# Patient Record
Sex: Female | Born: 1940
Health system: Southern US, Community
[De-identification: ages and names within clinical notes are randomized; demographics above are authoritative.]

## PROBLEM LIST (undated history)

## (undated) DIAGNOSIS — I341 Nonrheumatic mitral (valve) prolapse: Secondary | ICD-10-CM

---

## 1976-01-27 HISTORY — PX: BACK SURGERY: SHX140

## 1983-01-27 HISTORY — PX: ABDOMINAL HYSTERECTOMY: SHX81

## 1983-01-27 HISTORY — PX: APPENDECTOMY: SHX54

## 1990-01-26 HISTORY — PX: COLON SURGERY: SHX602

## 1997-11-28 ENCOUNTER — Other Ambulatory Visit: Admission: RE | Admit: 1997-11-28 | Discharge: 1997-11-28 | Payer: Self-pay | Admitting: Obstetrics and Gynecology

## 1999-09-12 ENCOUNTER — Other Ambulatory Visit: Admission: RE | Admit: 1999-09-12 | Discharge: 1999-09-12 | Payer: Self-pay | Admitting: Obstetrics and Gynecology

## 2000-09-15 ENCOUNTER — Other Ambulatory Visit: Admission: RE | Admit: 2000-09-15 | Discharge: 2000-09-15 | Payer: Self-pay | Admitting: Obstetrics and Gynecology

## 2002-07-07 ENCOUNTER — Encounter: Payer: Self-pay | Admitting: Allergy and Immunology

## 2002-07-07 ENCOUNTER — Encounter: Admission: RE | Admit: 2002-07-07 | Discharge: 2002-07-07 | Payer: Self-pay | Admitting: *Deleted

## 2016-02-06 ENCOUNTER — Emergency Department (HOSPITAL_BASED_OUTPATIENT_CLINIC_OR_DEPARTMENT_OTHER)
Admission: EM | Admit: 2016-02-06 | Discharge: 2016-02-06 | Disposition: A | Discharge status: Home / Self Care | Payer: Medicare Other | Attending: Emergency Medicine | Admitting: Emergency Medicine

## 2016-02-06 ENCOUNTER — Encounter (HOSPITAL_BASED_OUTPATIENT_CLINIC_OR_DEPARTMENT_OTHER): Payer: Self-pay | Admitting: *Deleted

## 2016-02-06 ENCOUNTER — Emergency Department (HOSPITAL_BASED_OUTPATIENT_CLINIC_OR_DEPARTMENT_OTHER): Payer: Medicare Other

## 2016-02-06 DIAGNOSIS — J181 Lobar pneumonia, unspecified organism: Secondary | ICD-10-CM | POA: Diagnosis not present

## 2016-02-06 DIAGNOSIS — R05 Cough: Secondary | ICD-10-CM | POA: Diagnosis present

## 2016-02-06 DIAGNOSIS — J189 Pneumonia, unspecified organism: Secondary | ICD-10-CM

## 2016-02-06 DIAGNOSIS — H66002 Acute suppurative otitis media without spontaneous rupture of ear drum, left ear: Secondary | ICD-10-CM | POA: Diagnosis not present

## 2016-02-06 HISTORY — DX: Nonrheumatic mitral (valve) prolapse: I34.1

## 2016-02-06 MED ORDER — AZITHROMYCIN 250 MG PO TABS: 250.0000 mg | ORAL_TABLET | Freq: Every day | ORAL | 0 refills | Status: AC

## 2016-02-06 MED FILL — AZITHROMYCIN 250 MG TABLET: 250 | 5 days supply | Qty: 6 | Fill #0

## 2016-02-06 NOTE — ED Provider Notes (Signed)
[facility]-EMERGENCY DEPT Provider Note   CSN: [id number] Arrival date & time: 02/06/16 [id number]     History    Chief Complaint  Patient presents with  . Cough     HPI Kim Howell is a 76 y.o. female.  76yo F who p/w cough and fever.5 days ago, the patient began feeling generally unwell and then developed a severe sore throat that lasted one day. She then developed a cough that is nonproductive and is associated with postnasal drip. She was having intermittent low-grade fevers until this morning when she had a fever of 101. She took Advil prior to arrival. She notes some associated left ear pain and "crackling" sounds in left ear that developed after sore throat. The vomiting, diarrhea, abdominal pain, chest pain, or shortness of breath.   Past Medical History:  Diagnosis Date  . MVP (mitral valve prolapse)      There are no active problems to display for this patient.   Past Surgical History:  Procedure Laterality Date  . ABDOMINAL HYSTERECTOMY  1985   total hysterectomy with appenditis removal  . APPENDECTOMY  1985   done with hysterectomy  . BACK SURGERY  1978   L4-5 fusion  . COLON SURGERY  1992    OB History    No data available        Home Medications    Prior to Admission medications   Medication Sig Start Date End Date Taking? Authorizing Provider  Multiple Vitamin (DAILY VITAMINS PO) Take by mouth.   Yes Historical Provider, MD      No family history on file.   Social History  Substance Use Topics  . Smoking status: Never Smoker  . Smokeless tobacco: Never Used  . Alcohol use 0.6 oz/week    1 Glasses of wine per week     Comment: occassionally     Allergies     Patient has no known allergies.    Review of Systems  10 Systems reviewed and are negative for acute change except as noted in the HPI.   Physical Exam Updated Vital Signs BP 133/79 (BP Location: Left Arm)   Pulse 97   Temp 98.4 F (36.9 C) (Oral)   Resp 20   SpO2  96%   Physical Exam  Constitutional: She is oriented to person, place, and time. She appears well-developed and well-nourished. No distress.  HENT:  Head: Normocephalic and atraumatic.  Right Ear: Tympanic membrane and ear canal normal.  Left Ear: Ear canal normal. Tympanic membrane is injected and erythematous.  Mouth/Throat: Oropharynx is clear and moist.  Moist mucous membranes  Eyes: Conjunctivae are normal. Pupils are equal, round, and reactive to light.  Neck: Neck supple.  Cardiovascular: Normal rate, regular rhythm and normal heart sounds.   No murmur heard. Pulmonary/Chest: Effort normal and breath sounds normal.  Occasional cough  Abdominal: Soft. Bowel sounds are normal. She exhibits no distension. There is no tenderness.  Musculoskeletal: She exhibits no edema.  Neurological: She is alert and oriented to person, place, and time.  Normal gait, Fluent speech  Skin: Skin is warm and dry. No rash noted.  Psychiatric: She has a normal mood and affect. Judgment normal.  Nursing note and vitals reviewed.     ED Treatments / Results  Labs (all labs ordered are listed, but only abnormal results are displayed) Labs Reviewed - No data to display   EKG  EKG Interpretation  Date/Time:    Ventricular Rate:    PR Interval:  QRS Duration:   QT Interval:    QTC Calculation:   R Axis:     Text Interpretation:           Radiology Dg Chest 2 View  Result Date: 02/06/2016 CLINICAL DATA:  Cough and fever for 1 week EXAM: CHEST  2 VIEW COMPARISON:  None. FINDINGS: There is hazy airspace disease in the peripheral and posterior right upper lobe. Lungs are under aerated. Normal heart size. No pneumothorax or pleural effusion. IMPRESSION: Right upper lobe pneumonia. Followup PA and lateral chest X-ray is recommended in 3-4 weeks following trial of antibiotic therapy to ensure resolution and exclude underlying malignancy. Electronically Signed   By: Jolaine Click M.D.   On:  02/06/2016 10:47    Procedures Procedures (including critical care time) Procedures  Medications Ordered in ED  Medications - No data to display   Initial Impression / Assessment and Plan / ED Course  I have reviewed the triage vital signs and the nursing notes.  Pertinent imaging results that were available during my care of the patient were reviewed by me and considered in my medical decision making (see chart for details).  Clinical Course     PT w/ 5d URI sx including cough, sore throat, now L ear pain. She was ambulatory and well-appearing on exam with normal vital signs. Normal work of breathing, no complaints of shortness of breath or chest pain. She did have a left otitis media on exam. Obtained chest x-ray because of report of fever and it does show a right upper lobe pneumonia. The patient is breathing comfortably without any hypoxia therefore I feel she is appropriate for outpatient management. Gave the patient azithromycin course to treat both CAP and AOM. His chest follow-up with PCP early next week for reevaluation. Reviewed return precautions including any worsening symptoms such as shortness of breath or worsening fevers. Patient voiced understanding and was discharged in satisfactory condition.  Final Clinical Impressions(s) / ED Diagnoses   Final diagnoses:  None     New Prescriptions   No medications on file       Laurence Spates, MD 02/06/16 1056

## 2016-02-06 NOTE — ED Triage Notes (Signed)
Patient states she developed a severe sore throat five days ago which lasted one day.  Developed a cough the next day with post nasal drip.  Has had an intermittent low grade fever of 99.6 until this morning, and had a temperature of 101.  Patient took advil pta.  Also, has some pain in the left ear.

## 2018-04-08 IMAGING — CR DG CHEST 2V
2 series · 2 of 2 positions shown · non-contrast
Comparison: None.

CLINICAL DATA: Cough and fever for 1 week

EXAM:
CHEST  2 VIEW

[w chest pa]
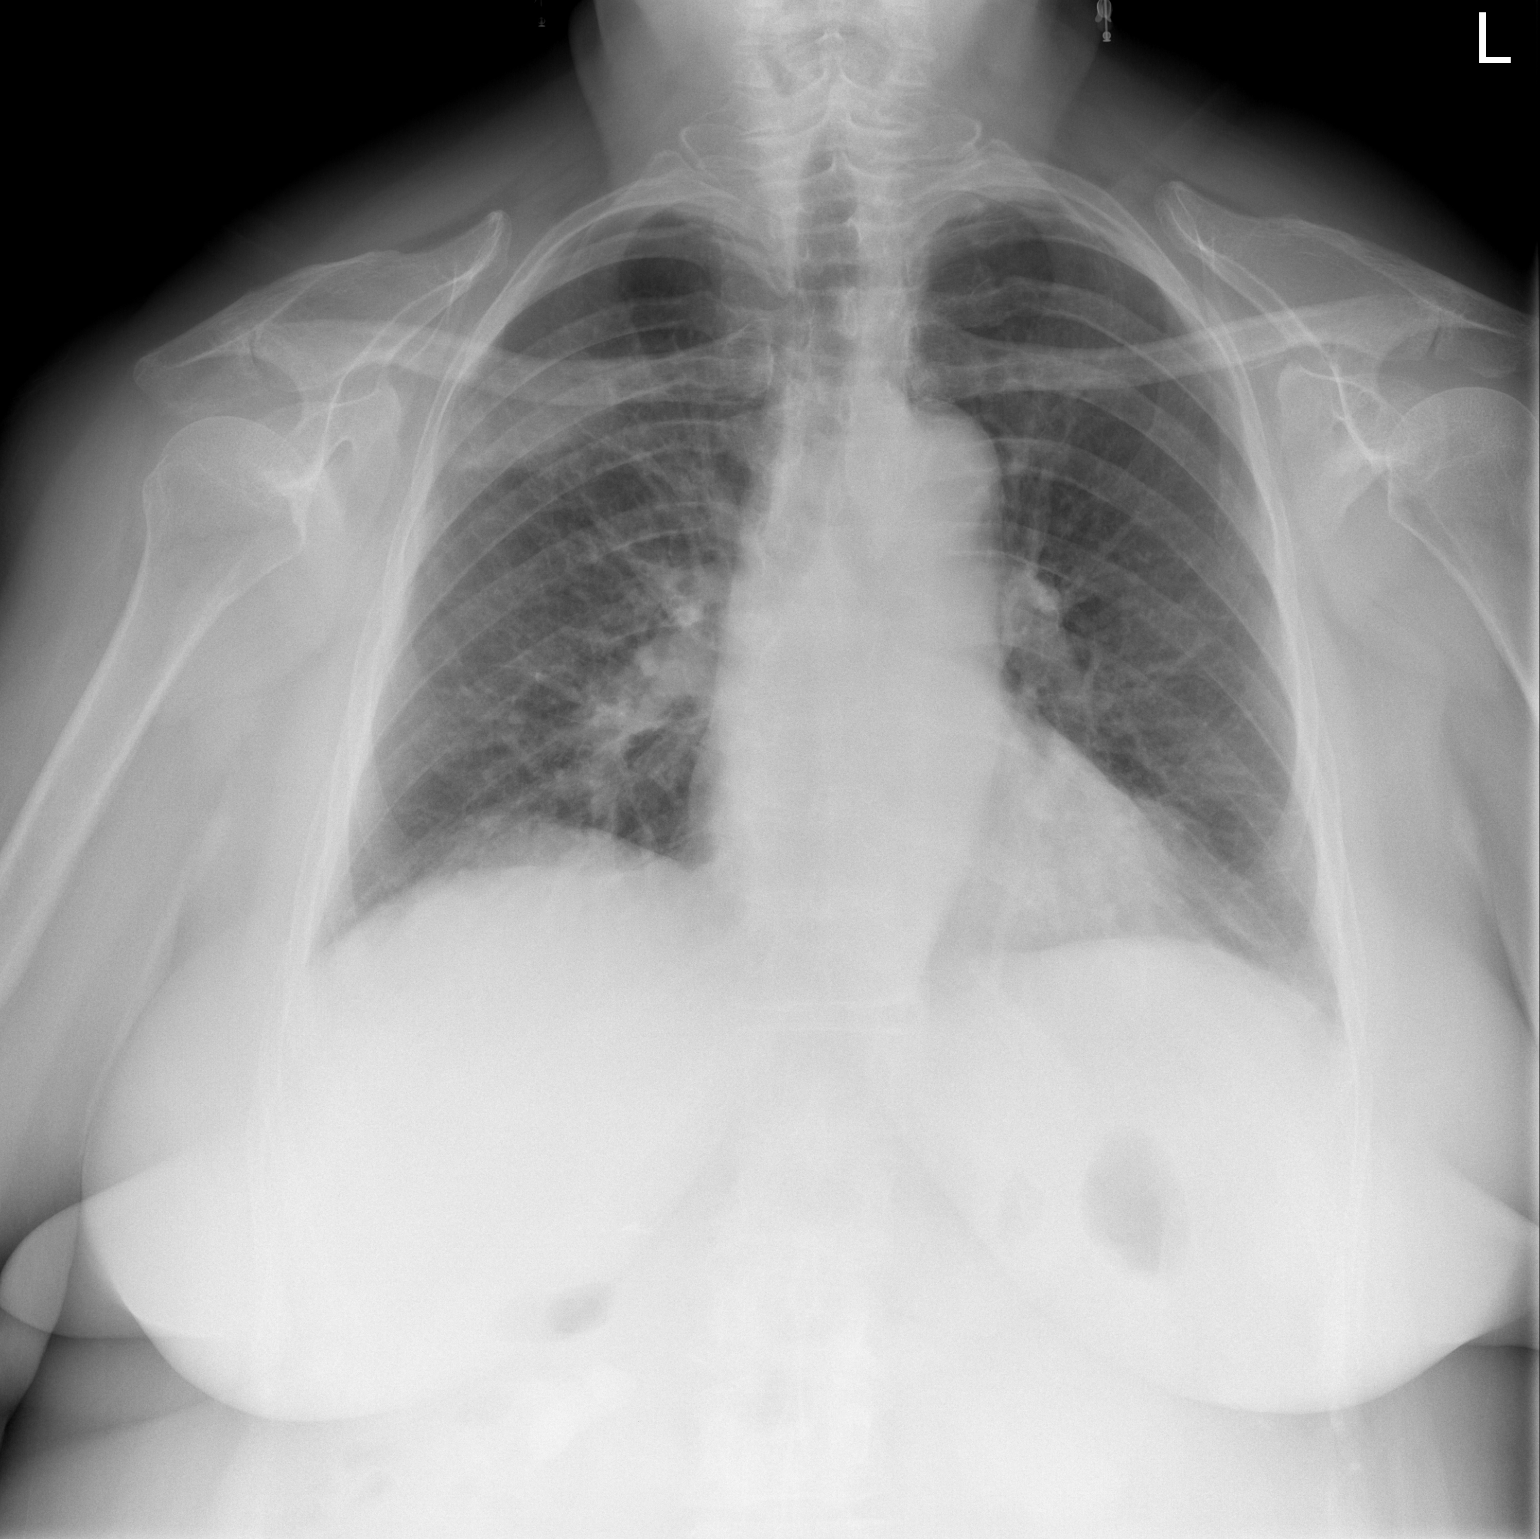

[w chest lat]
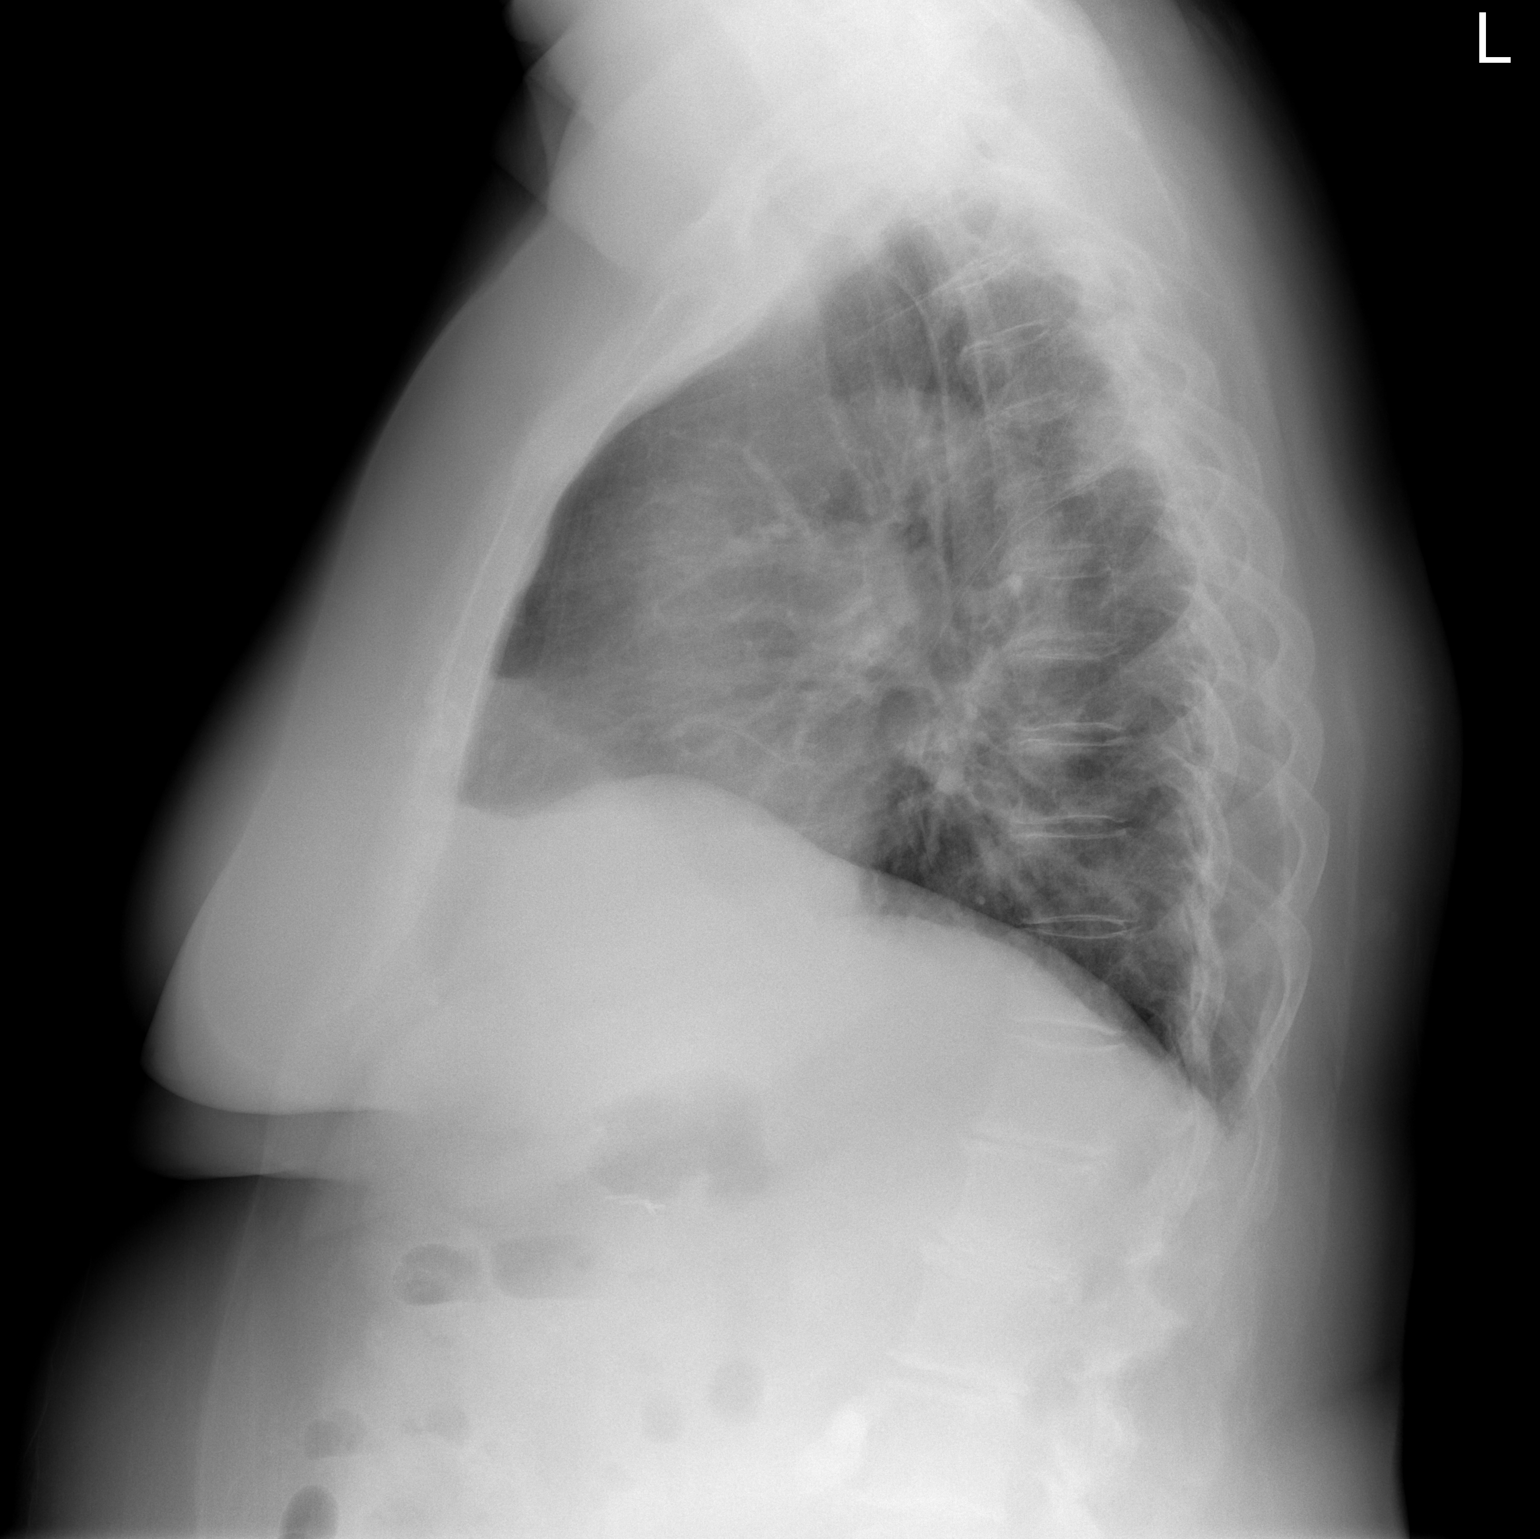

[2 of 2 positions shown; findings below may reference images not displayed]

FINDINGS: There is hazy airspace disease in the peripheral and posterior right
upper lobe. Lungs are under aerated. Normal heart size. No
pneumothorax or pleural effusion.
IMPRESSION: Right upper lobe pneumonia. Followup PA and lateral chest X-ray is
recommended in 3-4 weeks following trial of antibiotic therapy to
ensure resolution and exclude underlying malignancy.
